# Patient Record
Sex: Male | Born: 1950 | Race: White | Hispanic: No | Marital: Married | Smoking: Never smoker
Health system: Southern US, Community
[De-identification: ages and names within clinical notes are randomized; demographics above are authoritative.]

## PROBLEM LIST (undated history)

## (undated) DIAGNOSIS — Q423 Congenital absence, atresia and stenosis of anus without fistula: Secondary | ICD-10-CM

## (undated) DIAGNOSIS — I10 Essential (primary) hypertension: Secondary | ICD-10-CM

## (undated) DIAGNOSIS — L988 Other specified disorders of the skin and subcutaneous tissue: Secondary | ICD-10-CM

## (undated) DIAGNOSIS — Z5189 Encounter for other specified aftercare: Secondary | ICD-10-CM

## (undated) DIAGNOSIS — K2289 Other specified disease of esophagus: Secondary | ICD-10-CM

## (undated) DIAGNOSIS — K222 Esophageal obstruction: Secondary | ICD-10-CM

## (undated) DIAGNOSIS — N289 Disorder of kidney and ureter, unspecified: Secondary | ICD-10-CM

## (undated) HISTORY — PX: ABDOMINAL SURGERY: SHX537

## (undated) HISTORY — PX: OTHER SURGICAL HISTORY: SHX169

## (undated) HISTORY — PX: FRACTURE SURGERY: SHX138

---

## 1982-01-31 HISTORY — PX: SPLENECTOMY: SUR1306

## 2004-04-02 ENCOUNTER — Ambulatory Visit: Payer: Self-pay | Admitting: Unknown Physician Specialty

## 2007-04-12 ENCOUNTER — Ambulatory Visit: Payer: Self-pay | Admitting: Family Medicine

## 2010-06-12 ENCOUNTER — Emergency Department: Payer: Self-pay | Admitting: Emergency Medicine
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
## 2011-12-04 IMAGING — CT CT HEAD WITHOUT CONTRAST
2 series · 16 of 30 positions shown, 20 images · non-contrast
Comparison: none

REASON FOR EXAM: headache
COMMENTS:

PROCEDURE:     CT  - CT HEAD WITHOUT CONTRAST  - June 12, 2010  [DATE]
RESULT:     History: Headache.
Comparison Study: No prior.

[Series 2: without · axial · non-contrast · 0.44mm/px · z∈[+384,+510]mm · 13 of 31 slices shown, 17 images]
[im 3/31  brain]
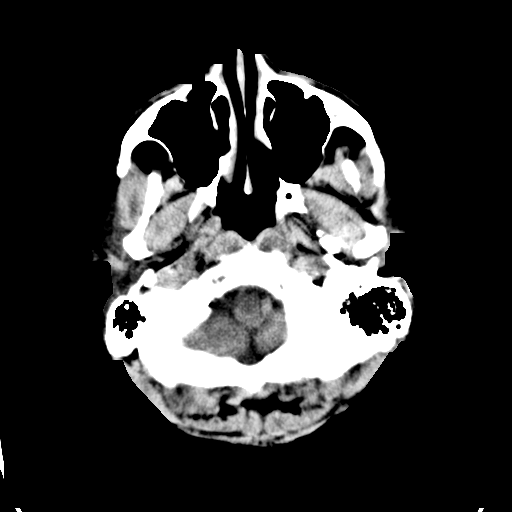
[im 3/31  bone]
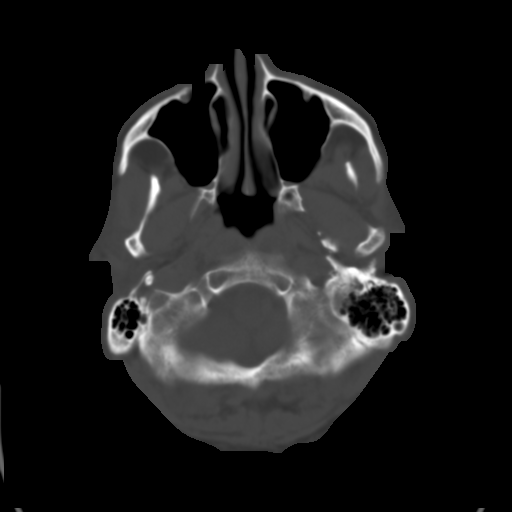
[im 5/31  brain]
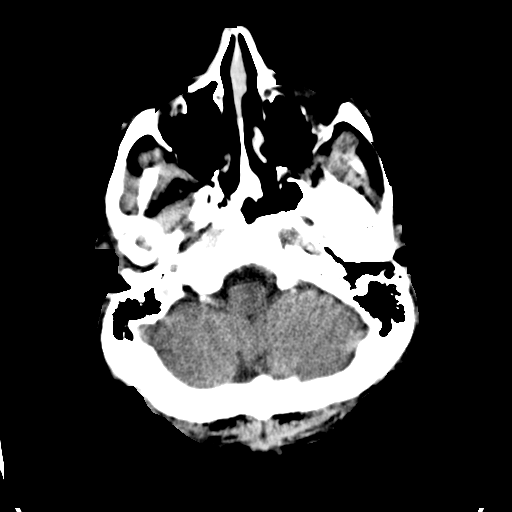
[im 7/31  brain]
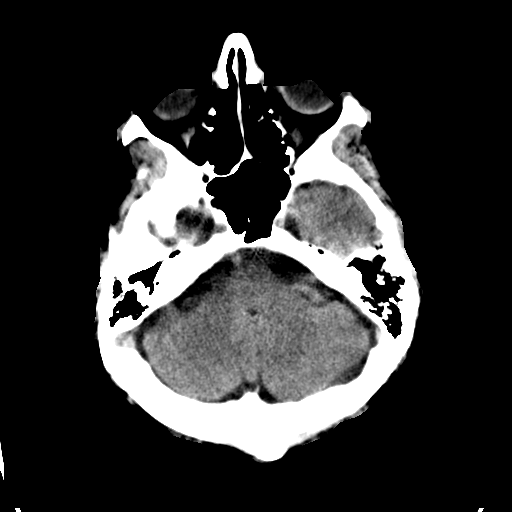
[im 9/31  brain]
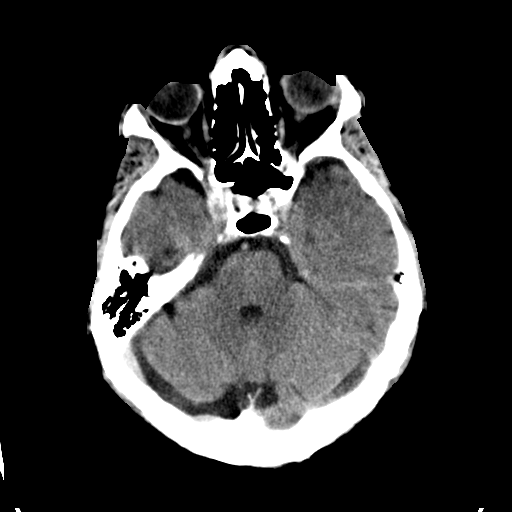
[im 11/31  brain]
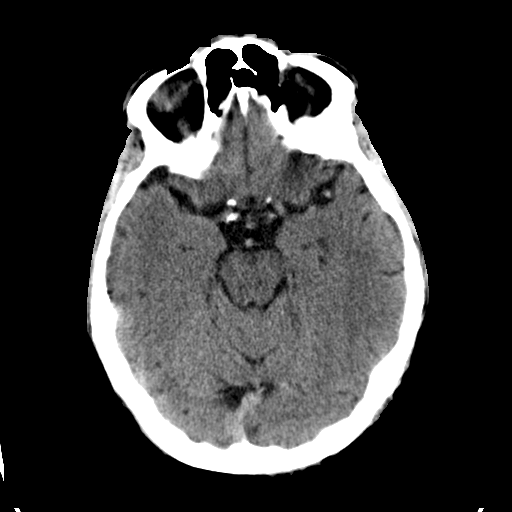
[im 11/31  bone]
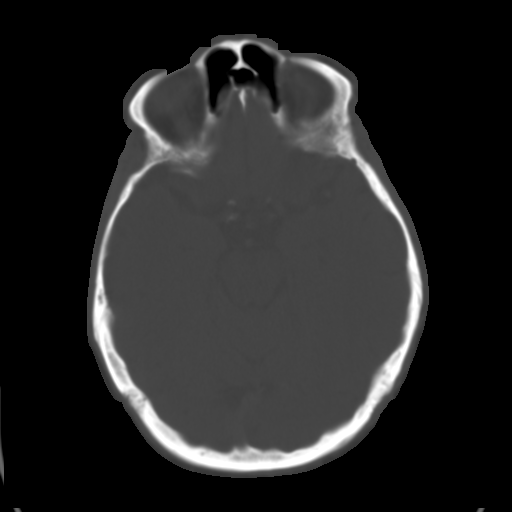
[im 13/31  brain]
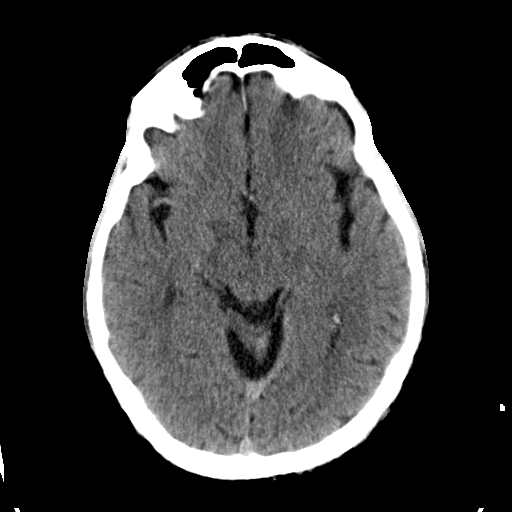
[im 16/31  brain]
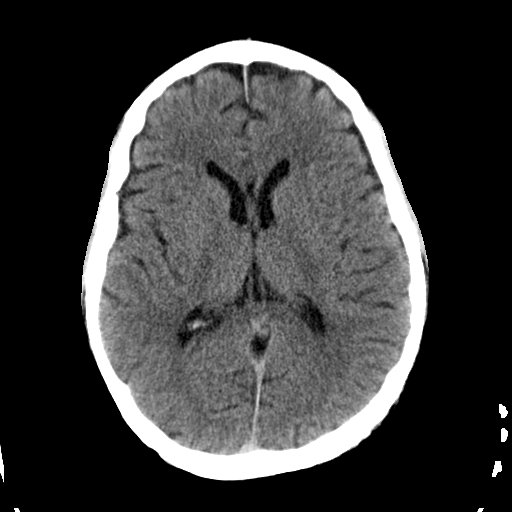
[im 18/31  brain]
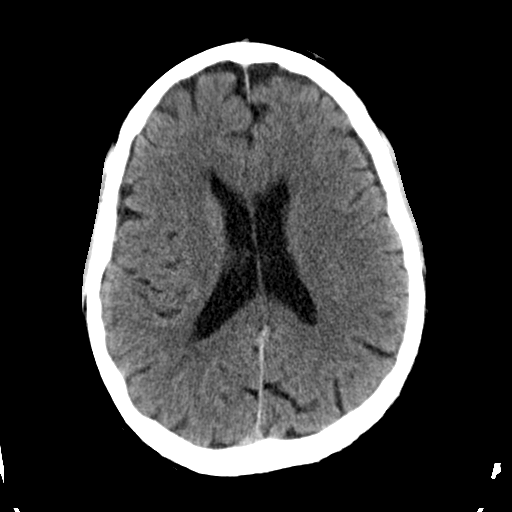
[im 20/31  brain]
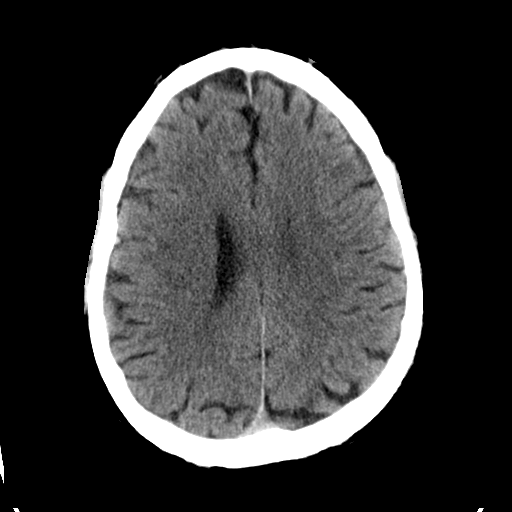
[im 20/31  bone]
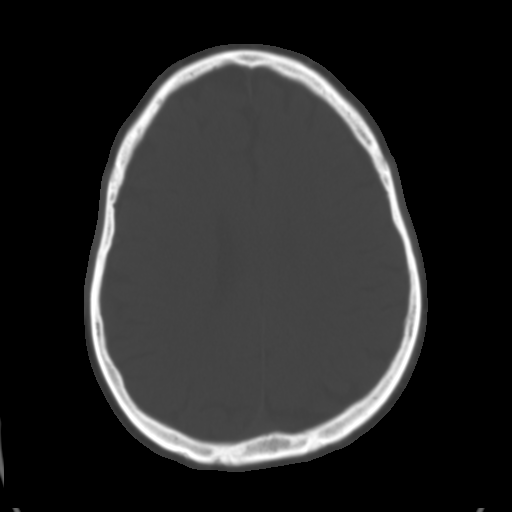
[im 22/31  brain]
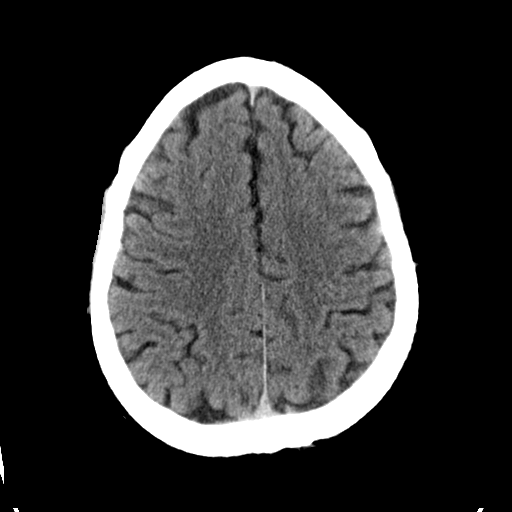
[im 24/31  brain]
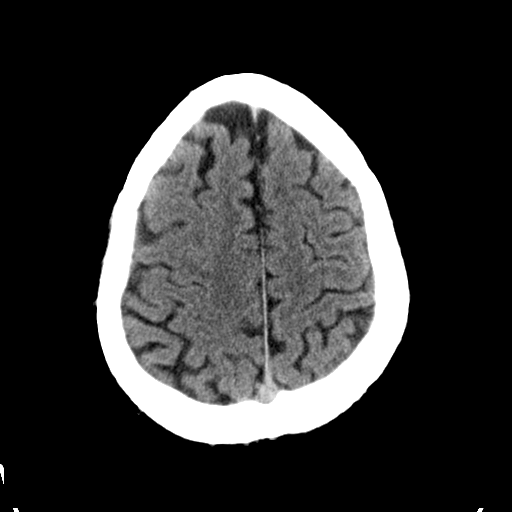
[im 26/31  brain]
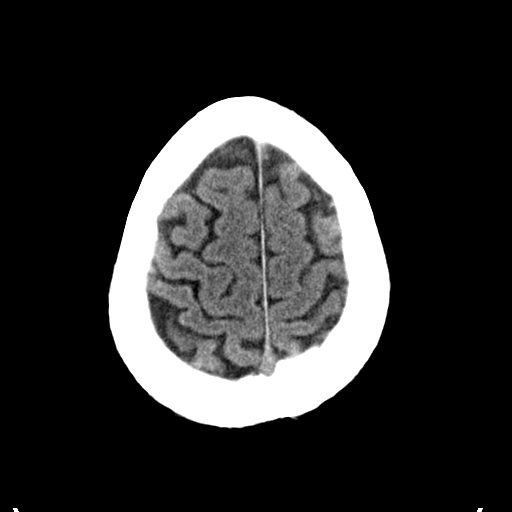
[im 28/31  brain]
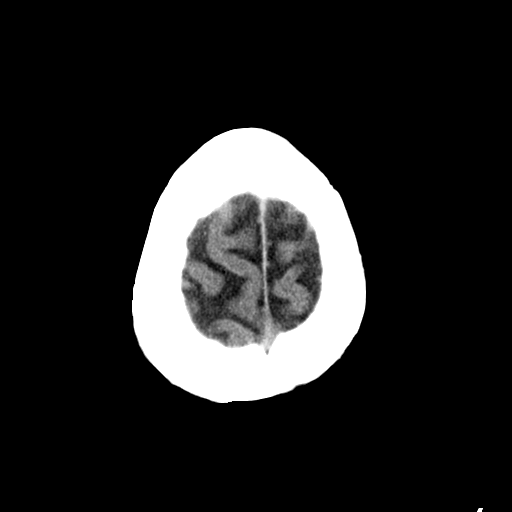
[im 28/31  bone]
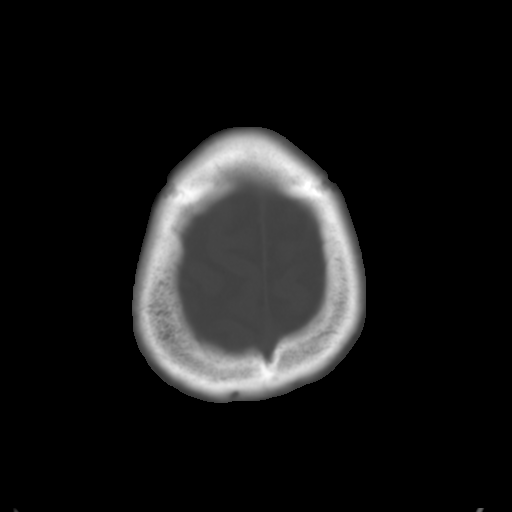

[Series 3: bone · axial · 0.44mm/px · z∈[+384,+424]mm · 3 of 31 slices shown]
[im 3/31  bone]
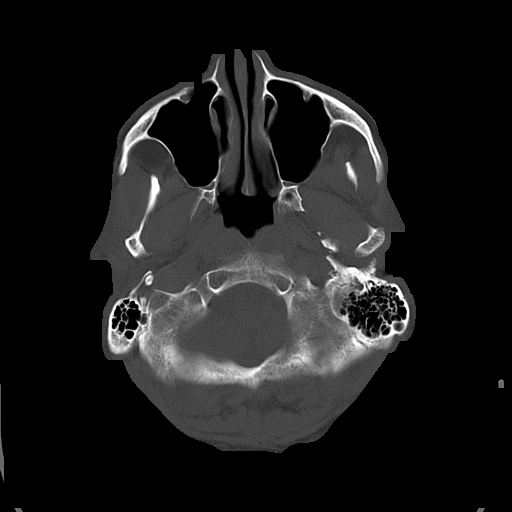
[im 7/31  bone]
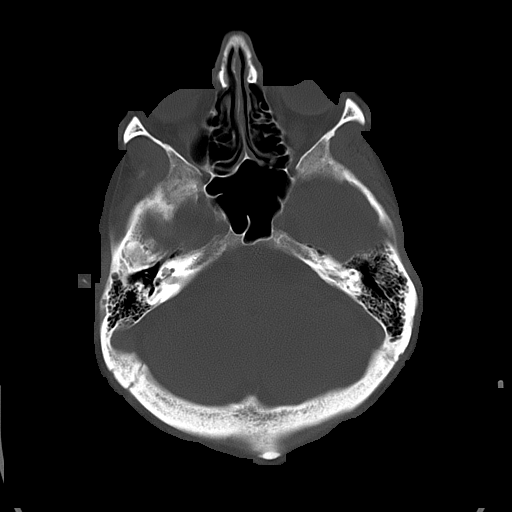
[im 11/31  bone]
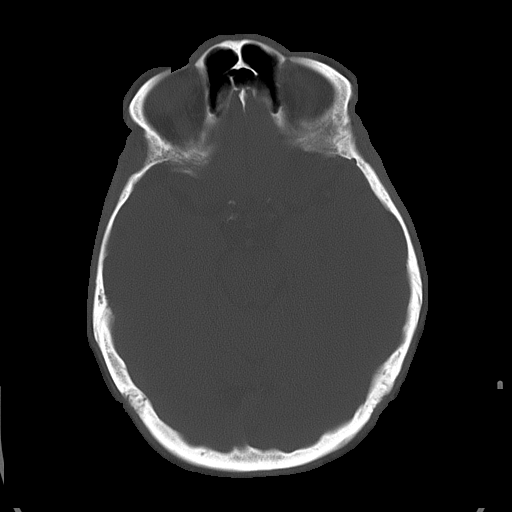

[16 of 30 positions shown; findings below may reference images not displayed]

FINDINGS: No mass. No hydrocephalus. No hemorrhage. No acute bony
abnormality identified. Sinuses clear.
IMPRESSION: No acute abnormality.

## 2015-05-25 ENCOUNTER — Other Ambulatory Visit: Payer: Self-pay | Admitting: Family Medicine

## 2015-05-25 ENCOUNTER — Ambulatory Visit
Admission: RE | Admit: 2015-05-25 | Discharge: 2015-05-25 | Disposition: A | Discharge status: Home / Self Care | Payer: Medicare HMO | Source: Ambulatory Visit | Attending: Family Medicine | Admitting: Family Medicine

## 2015-05-25 DIAGNOSIS — R059 Cough, unspecified: Secondary | ICD-10-CM

## 2015-05-25 DIAGNOSIS — R05 Cough: Secondary | ICD-10-CM

## 2015-09-26 ENCOUNTER — Emergency Department
Admission: EM | Admit: 2015-09-26 | Discharge: 2015-09-26 | Disposition: A | Discharge status: Home / Self Care | Payer: Medicare HMO | Attending: Emergency Medicine | Admitting: Emergency Medicine

## 2015-09-26 DIAGNOSIS — H109 Unspecified conjunctivitis: Secondary | ICD-10-CM | POA: Insufficient documentation

## 2015-09-26 DIAGNOSIS — H578 Other specified disorders of eye and adnexa: Secondary | ICD-10-CM | POA: Diagnosis present

## 2015-09-26 DIAGNOSIS — H5789 Other specified disorders of eye and adnexa: Secondary | ICD-10-CM

## 2015-09-26 MED ORDER — TETRACAINE HCL 0.5 % OP SOLN
1.0000 [drp] | Freq: Once | OPHTHALMIC | Status: AC
  Administered 2015-09-26: 1 [drp] via OPHTHALMIC

## 2015-09-26 MED ORDER — OFLOXACIN 0.3 % OP SOLN: 2.0000 [drp] | Freq: Four times a day (QID) | OPHTHALMIC | 0 refills | Status: AC

## 2015-09-26 MED ORDER — FLUORESCEIN SODIUM 1 MG OP STRP
ORAL_STRIP | OPHTHALMIC | Status: AC
  Administered 2015-09-26: 1 via OPHTHALMIC
  Filled 2015-09-26: qty 1

## 2015-09-26 MED ORDER — FLUORESCEIN SODIUM 1 MG OP STRP
1.0000 | ORAL_STRIP | Freq: Once | OPHTHALMIC | Status: AC
  Administered 2015-09-26: 1 via OPHTHALMIC

## 2015-09-26 MED ORDER — TETRACAINE HCL 0.5 % OP SOLN
OPHTHALMIC | Status: AC
  Administered 2015-09-26: 1 [drp] via OPHTHALMIC
  Filled 2015-09-26: qty 2

## 2015-09-26 NOTE — ED Provider Notes (Signed)
[facility] Emergency Department Provider Note   ____________________________________________   First MD Initiated Contact with Patient 09/26/15 0700     (approximate)  I have reviewed the triage vital signs and the nursing notes.   HISTORY  Chief Complaint Eye Problem   HPI Mark Bond is a 65 y.o. male without any chronic medical conditions was presenting to the emergency department today with left eye discharge and redness. He denies any change in his vision. He says that he felt something fly into his left eye when he was weed whacking yesterday. After he felt the foreign body sensation he flushed his eye and put ointment on his eye in addition to Visine. He says that he has since developed a discharge and discomfort in his eye especially laterally. He denies wearing contacts.  No past medical history on file.  There are no active problems to display for this patient.   No past surgical history on file.  Prior to Admission medications   Not on File    Allergies Review of patient's allergies indicates no known allergies.  No family history on file.  Social History Social History  Substance Use Topics  . Smoking status: Not on file  . Smokeless tobacco: Not on file  . Alcohol use Not on file    Review of Systems Constitutional: No fever/chills Eyes: No visual changes.As above. Patient denies any visual changes or blurring to left eye. ENT: No sore throat. Cardiovascular: Denies chest pain. Respiratory: Denies shortness of breath. Gastrointestinal: No abdominal pain.  No nausea, no vomiting.  No diarrhea.  No constipation. Genitourinary: Negative for dysuria. Musculoskeletal: Negative for back pain. Skin: Negative for rash. Neurological: Negative for headaches, focal weakness or numbness.  10-point ROS otherwise negative.  ____________________________________________   PHYSICAL EXAM:  VITAL SIGNS: ED Triage Vitals  Enc  Vitals Group     BP 09/26/15 0458 138/89     Pulse Rate 09/26/15 0458 62     Resp 09/26/15 0458 16     Temp 09/26/15 0458 97.7 F (36.5 C)     Temp Source 09/26/15 0458 Oral     SpO2 09/26/15 0458 98 %     Weight 09/26/15 0352 152 lb (68.9 kg)     Height 09/26/15 0352 5\' 9"  (1.753 m)     Head Circumference --      Peak Flow --      Pain Score 09/26/15 0352 3     Pain Loc --      Pain Edu? --      Excl. in GC? --     Constitutional: Alert and oriented. Well appearing and in no acute distress. Eyes: Mild conjunctival erythema to the left eye with edema lateral to the cornea which is mild to moderate. PERRL. note with tetracaine. EOMI.  I examined with a Woods lamp and I do not see any foreign body without staining. Eyelids were everted. With staining there is no uptake. There is no Seidel sign. There is also a mild yellow discharge and mild swelling to both eyelids. Head: Atraumatic. Nose: No congestion/rhinnorhea. Mouth/Throat: Mucous membranes are moist.   Neck: No stridor.   Cardiovascular: Normal rate, regular rhythm. Grossly normal heart sounds.   Respiratory: Normal respiratory effort.  No retractions. Lungs CTAB. Gastrointestinal:  No distention.  Musculoskeletal: No lower extremity tenderness nor edema.  Neurologic:  Normal speech and language. No gross focal neurologic deficits are appreciated. No gait instability. Skin:  Skin is warm, dry  and intact. No rash noted. Psychiatric: Mood and affect are normal. Speech and behavior are normal.  ____________________________________________   LABS (all labs ordered are listed, but only abnormal results are displayed)  Labs Reviewed - No data to display ____________________________________________  EKG   ____________________________________________  RADIOLOGY   ____________________________________________   PROCEDURES  Procedure(s) performed:   Procedures  Critical Care performed:    ____________________________________________   INITIAL IMPRESSION / ASSESSMENT AND PLAN / ED COURSE  Pertinent labs & imaging results that were available during my care of the patient were reviewed by me and considered in my medical decision making (see chart for details).  Patient with possible foreign body that has now been flushed out but with remaining irritation. I did not visualize any foreign body with a Woods lamp. I discussed the case with Dr. Brooke Dare of ophthalmology who agrees with my plan to discharge the patient with an antibiotic and have him follow up on Monday morning at 8 AM in his office. The patient we'll do go as a walk-in. Also, Dr. Brooke Dare says that the patient may call the office at any time if there are any worsening or concerning symptoms where he would need to be seen emergently by ophthalmology. I explained this plan to the patient and his understanding willing to comply.  Clinical Course   Scleral edema. Conjunctivitis.  ____________________________________________   FINAL CLINICAL IMPRESSION(S) / ED DIAGNOSES      NEW MEDICATIONS STARTED DURING THIS VISIT:  New Prescriptions   No medications on file     Note:  This document was prepared using Dragon voice recognition software and may include unintentional dictation errors.    Myrna Blazer, MD 09/26/15 [phone]

## 2015-09-26 NOTE — ED Triage Notes (Signed)
Pt ambulatory to registation with no difficulty. Pt reports he was weed eating today and got something in his left eye. Eye red, swollen and tearing at this time. Pt reports using visine and an opthalmic ointment he had.

## 2015-10-25 MED ORDER — METHYLPREDNISOLONE SODIUM SUCC 125 MG IJ SOLR
INTRAMUSCULAR | Status: AC
  Filled 2015-10-25: qty 2

## 2015-10-25 MED ORDER — IPRATROPIUM-ALBUTEROL 0.5-2.5 (3) MG/3ML IN SOLN
RESPIRATORY_TRACT | Status: AC
  Filled 2015-10-25: qty 9
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
## 2023-08-28 ENCOUNTER — Emergency Department
Admission: EM | Admit: 2023-08-28 | Discharge: 2023-08-28 | Disposition: A | Discharge status: Home / Self Care | Payer: Self-pay | Attending: Emergency Medicine | Admitting: Emergency Medicine

## 2023-08-28 ENCOUNTER — Other Ambulatory Visit: Payer: Self-pay

## 2023-08-28 DIAGNOSIS — R531 Weakness: Secondary | ICD-10-CM | POA: Diagnosis not present

## 2023-08-28 DIAGNOSIS — R42 Dizziness and giddiness: Secondary | ICD-10-CM | POA: Insufficient documentation

## 2023-08-28 HISTORY — DX: Esophageal obstruction: K22.2

## 2023-08-28 HISTORY — DX: Congenital absence, atresia and stenosis of anus without fistula: Q42.3

## 2023-08-28 HISTORY — DX: Essential (primary) hypertension: I10

## 2023-08-28 HISTORY — DX: Other specified disease of esophagus: K22.89

## 2023-08-28 HISTORY — DX: Disorder of kidney and ureter, unspecified: N28.9

## 2023-08-28 HISTORY — DX: Other specified disorders of the skin and subcutaneous tissue: L98.8

## 2023-08-28 HISTORY — DX: Encounter for other specified aftercare: Z51.89

## 2023-08-28 LAB — URINALYSIS, ROUTINE W REFLEX MICROSCOPIC
Bilirubin Urine: NEGATIVE
Glucose, UA: NEGATIVE mg/dL
Hgb urine dipstick: NEGATIVE
Ketones, ur: NEGATIVE mg/dL
Nitrite: POSITIVE — AB
Protein, ur: 30 mg/dL — AB
Specific Gravity, Urine: 1.013 (ref 1.005–1.030)
WBC, UA: >50 WBC/hpf (ref 0–5)
pH: 7 (ref 5.0–8.0)

## 2023-08-28 LAB — COMPREHENSIVE METABOLIC PANEL WITH GFR
ALT: 14 U/L (ref 0–44)
AST: 18 U/L (ref 15–41)
Albumin: 3.6 g/dL (ref 3.5–5.0)
Alkaline Phosphatase: 77 U/L (ref 38–126)
Anion gap: 11 (ref 5–15)
BUN: 11 mg/dL (ref 8–23)
CO2: 25 mmol/L (ref 22–32)
Calcium: 9.8 mg/dL (ref 8.9–10.3)
Chloride: 105 mmol/L (ref 98–111)
Creatinine, Ser: 1.1 mg/dL (ref 0.61–1.24)
GFR, Estimated: >60 mL/min (ref >60)
Glucose, Bld: 77 mg/dL (ref 70–99)
Potassium: 3.8 mmol/L (ref 3.5–5.1)
Sodium: 141 mmol/L (ref 135–145)
Total Bilirubin: 0.7 mg/dL (ref 0.0–1.2)
Total Protein: 7.3 g/dL (ref 6.5–8.1)

## 2023-08-28 LAB — CBC
HCT: 50.1 % (ref 39.0–52.0)
Hemoglobin: 16.4 g/dL (ref 13.0–17.0)
MCH: 28.1 pg (ref 26.0–34.0)
MCHC: 32.7 g/dL (ref 30.0–36.0)
MCV: 85.9 fL (ref 80.0–100.0)
Platelets: 297 K/uL (ref 150–400)
RBC: 5.83 MIL/uL — ABNORMAL HIGH (ref 4.22–5.81)
RDW: 16.7 % — ABNORMAL HIGH (ref 11.5–15.5)
WBC: 9.4 K/uL (ref 4.0–10.5)
nRBC: 0 % (ref 0.0–0.2)

## 2023-08-28 NOTE — ED Triage Notes (Signed)
 Pt to ED POV for bilateral arm weakness lasting 5 minutes that occurred about 20 minutes ago while folding clothes. Had 1 episode of diarrhea today.  Pt has full ROM to both arms and is ambulatory. Pt has equal hand grip (strong) and no arm drift. Pt states he just feels weak all over. Denies dizziness. PA in triage.

## 2023-08-28 NOTE — Discharge Instructions (Addendum)
 Your lab work was reassuring here, please follow-up closely with your PCP, return immediately if any new symptoms or worsening symptoms

## 2023-08-28 NOTE — ED Provider Notes (Signed)
 [facility] Provider Note    Event Date/Time   First MD Initiated Contact with Patient 08/28/23 1846     (approximate)   History   Extremity Weakness   HPI  Mark Bond is a 73 y.o. male who reports that he was doing laundry today and felt weak in the upper extremities and lightheaded, he reports this passed within 5 minutes and has been feeling well since then.  He denies chest pain, no back pain, no headache, no other neurodeficits.  Has been feeling well for several hours now     Physical Exam   Triage Vital Signs: ED Triage Vitals  Encounter Vitals Group     BP 08/28/23 1522 (!) 136/93     Girls Systolic BP Percentile --      Girls Diastolic BP Percentile --      Boys Systolic BP Percentile --      Boys Diastolic BP Percentile --      Pulse Rate 08/28/23 1522 76     Resp 08/28/23 1522 20     Temp 08/28/23 1522 97.8 F (36.6 C)     Temp Source 08/28/23 1522 Oral     SpO2 08/28/23 1522 97 %     Weight 08/28/23 1529 72.6 kg (160 lb)     Height 08/28/23 1529 1.778 m (5' 10)     Head Circumference --      Peak Flow --      Pain Score 08/28/23 1523 0     Pain Loc --      Pain Education --      Exclude from Growth Chart --     Most recent vital signs: Vitals:   08/28/23 1522 08/28/23 1929  BP: (!) 136/93 129/84  Pulse: 76 71  Resp: 20 20  Temp: 97.8 F (36.6 C)   SpO2: 97% 99%     General: Awake, no distress.  CV:  Good peripheral perfusion.  Regular rate and rhythm Resp:  Normal effort.  Clear to auscultation bilaterally Abd:  No distention.  No CVA tenderness Other:  Normal strength in all extremities, cranial nerves II through XII are normal,   ED Results / Procedures / Treatments   Labs (all labs ordered are listed, but only abnormal results are displayed) Labs Reviewed  CBC - Abnormal; Notable for the following components:      Result Value   RBC 5.83 (*)    RDW 16.7 (*)    All other components within normal  limits  URINALYSIS, ROUTINE W REFLEX MICROSCOPIC - Abnormal; Notable for the following components:   Color, Urine YELLOW (*)    APPearance CLOUDY (*)    Protein, ur 30 (*)    Nitrite POSITIVE (*)    Leukocytes,Ua LARGE (*)    Bacteria, UA MANY (*)    All other components within normal limits  COMPREHENSIVE METABOLIC PANEL WITH GFR  CBG MONITORING, ED     EKG  ED ECG REPORT I, Lamar Price, the attending physician, personally viewed and interpreted this ECG.  Date: 08/28/2023  Rhythm: normal sinus rhythm QRS Axis: normal Intervals: normal ST/T Wave abnormalities: normal Narrative Interpretation: no evidence of acute ischemia    RADIOLOGY     PROCEDURES:  Critical Care performed:   Procedures   MEDICATIONS ORDERED IN ED: Medications - No data to display   IMPRESSION / MDM / ASSESSMENT AND PLAN / ED COURSE  I reviewed the triage vital signs and the nursing notes.  Patient's presentation is most consistent with acute presentation with potential threat to life or bodily function.  Patient presents with weakness as detailed above, history seems to suggest a near syncopal episode, the patient had been working outside just prior to this, question he related illness, electrolyte abnormality.  Does not appear consistent with CVA given bilateral nature.  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  He has no dysuria, no CVA tenderness or fevers  Given the patient is feeling well here and has been asymptomatic for many hours with reassuring workup, feel he is appropriate for discharge, he agrees with this plan, he will return if any change in symptoms        FINAL CLINICAL IMPRESSION(S) / ED DIAGNOSES   Final diagnoses:  Weakness     Rx / DC Orders   ED Discharge Orders     None        Note:  This document was prepared  using Dragon voice recognition software and may include unintentional dictation errors.   Arlander Charleston, MD 08/28/23 2302

## 2023-08-28 NOTE — ED Provider Triage Note (Addendum)
 Emergency Medicine Provider Triage Evaluation Note  Mark Bond , a 73 y.o. male  was evaluated in triage.  Pt complains of upper extremities weakness.  Today patient he was folding clothes 20 minutes ago with acute onset of weakness on his upper extremities.  Patient denies working outdoors.  States having today at 1 PM an episode of of diarrhea.  Review of Systems  Positive:  Negative:   Physical Exam  BP (!) 136/93 (BP Location: Left Arm)   Pulse 76   Temp 97.8 F (36.6 C) (Oral)   Resp 20   SpO2 97%  Gen:   Awake, no distress during triage patient is hypertensive Resp:  Normal effort  MSK:   Moves extremities without difficulty  Other:    Medical Decision Making  Medically screening exam initiated at 3:24 PM.  Appropriate orders placed.  Mark Bond was informed that the remainder of the evaluation will be completed by another provider, this initial triage assessment does not replace that evaluation, and the importance of remaining in the ED until their evaluation is complete.  Patient who presents with history of acute onset of upper extremity weakness.  No focal deficits.  Ordered CBC and CMP   Janit Kast, PA-C 08/28/23 1525    Janit Kast, PA-C 08/28/23 1527

## 2023-09-28 ENCOUNTER — Emergency Department
Admission: EM | Admit: 2023-09-28 | Discharge: 2023-09-28 | Disposition: A | Discharge status: Home / Self Care | Attending: Emergency Medicine | Admitting: Emergency Medicine

## 2023-09-28 ENCOUNTER — Other Ambulatory Visit: Payer: Self-pay

## 2023-09-28 ENCOUNTER — Emergency Department

## 2023-09-28 DIAGNOSIS — N189 Chronic kidney disease, unspecified: Secondary | ICD-10-CM | POA: Diagnosis not present

## 2023-09-28 DIAGNOSIS — I129 Hypertensive chronic kidney disease with stage 1 through stage 4 chronic kidney disease, or unspecified chronic kidney disease: Secondary | ICD-10-CM | POA: Insufficient documentation

## 2023-09-28 DIAGNOSIS — N390 Urinary tract infection, site not specified: Secondary | ICD-10-CM | POA: Diagnosis not present

## 2023-09-28 DIAGNOSIS — K449 Diaphragmatic hernia without obstruction or gangrene: Secondary | ICD-10-CM | POA: Diagnosis not present

## 2023-09-28 DIAGNOSIS — R197 Diarrhea, unspecified: Secondary | ICD-10-CM | POA: Diagnosis not present

## 2023-09-28 DIAGNOSIS — R918 Other nonspecific abnormal finding of lung field: Secondary | ICD-10-CM | POA: Diagnosis not present

## 2023-09-28 DIAGNOSIS — R339 Retention of urine, unspecified: Secondary | ICD-10-CM | POA: Diagnosis present

## 2023-09-28 DIAGNOSIS — R112 Nausea with vomiting, unspecified: Secondary | ICD-10-CM

## 2023-09-28 LAB — CBC WITH DIFFERENTIAL/PLATELET
Abs Immature Granulocytes: 0.04 K/uL (ref 0.00–0.07)
Basophils Absolute: 0.1 K/uL (ref 0.0–0.1)
Basophils Relative: 1 %
Eosinophils Absolute: 0 K/uL (ref 0.0–0.5)
Eosinophils Relative: 0 %
HCT: 49.1 % (ref 39.0–52.0)
Hemoglobin: 16.1 g/dL (ref 13.0–17.0)
Immature Granulocytes: 0 %
Lymphocytes Relative: 8 %
Lymphs Abs: 1.2 K/uL (ref 0.7–4.0)
MCH: 28 pg (ref 26.0–34.0)
MCHC: 32.8 g/dL (ref 30.0–36.0)
MCV: 85.2 fL (ref 80.0–100.0)
Monocytes Absolute: 1.2 K/uL — ABNORMAL HIGH (ref 0.1–1.0)
Monocytes Relative: 8 %
Neutro Abs: 12.7 K/uL — ABNORMAL HIGH (ref 1.7–7.7)
Neutrophils Relative %: 83 %
Platelets: 299 K/uL (ref 150–400)
RBC: 5.76 MIL/uL (ref 4.22–5.81)
RDW: 17.4 % — ABNORMAL HIGH (ref 11.5–15.5)
WBC: 15.2 K/uL — ABNORMAL HIGH (ref 4.0–10.5)
nRBC: 0 % (ref 0.0–0.2)

## 2023-09-28 LAB — LIPASE, BLOOD: Lipase: 31 U/L (ref 11–51)

## 2023-09-28 LAB — COMPREHENSIVE METABOLIC PANEL WITH GFR
ALT: 12 U/L (ref 0–44)
AST: 20 U/L (ref 15–41)
Albumin: 3.8 g/dL (ref 3.5–5.0)
Alkaline Phosphatase: 73 U/L (ref 38–126)
Anion gap: 11 (ref 5–15)
BUN: 11 mg/dL (ref 8–23)
CO2: 23 mmol/L (ref 22–32)
Calcium: 9.8 mg/dL (ref 8.9–10.3)
Chloride: 106 mmol/L (ref 98–111)
Creatinine, Ser: 1.05 mg/dL (ref 0.61–1.24)
GFR, Estimated: >60 mL/min (ref >60)
Glucose, Bld: 100 mg/dL — ABNORMAL HIGH (ref 70–99)
Potassium: 3.8 mmol/L (ref 3.5–5.1)
Sodium: 140 mmol/L (ref 135–145)
Total Bilirubin: 1 mg/dL (ref 0.0–1.2)
Total Protein: 7 g/dL (ref 6.5–8.1)

## 2023-09-28 LAB — URINALYSIS, ROUTINE W REFLEX MICROSCOPIC
Bilirubin Urine: NEGATIVE
Glucose, UA: NEGATIVE mg/dL
Ketones, ur: NEGATIVE mg/dL
Nitrite: NEGATIVE
Protein, ur: NEGATIVE mg/dL
Specific Gravity, Urine: 1.004 — ABNORMAL LOW (ref 1.005–1.030)
Squamous Epithelial / HPF: 0 /HPF (ref 0–5)
WBC, UA: >50 WBC/hpf (ref 0–5)
pH: 6 (ref 5.0–8.0)

## 2023-09-28 LAB — TROPONIN I (HIGH SENSITIVITY): Troponin I (High Sensitivity): 7 ng/L (ref <18)

## 2023-09-28 MED ORDER — SODIUM CHLORIDE 0.9 % IV BOLUS (SEPSIS)
500.0000 mL | Freq: Once | INTRAVENOUS | Status: AC
  Administered 2023-09-28: 500 mL via INTRAVENOUS

## 2023-09-28 MED ORDER — SODIUM CHLORIDE 0.9 % IV SOLN
2.0000 g | Freq: Once | INTRAVENOUS | Status: AC
  Administered 2023-09-28: 2 g via INTRAVENOUS
  Filled 2023-09-28: qty 20

## 2023-09-28 MED ORDER — CEPHALEXIN 500 MG PO CAPS: 500.0000 mg | ORAL_CAPSULE | Freq: Two times a day (BID) | ORAL | 0 refills | Status: AC

## 2023-09-28 MED ORDER — ONDANSETRON HCL 4 MG/2ML IJ SOLN
4.0000 mg | Freq: Once | INTRAMUSCULAR | Status: AC
  Administered 2023-09-28: 4 mg via INTRAVENOUS
  Filled 2023-09-28: qty 2

## 2023-09-28 MED ORDER — IOHEXOL 300 MG/ML  SOLN
100.0000 mL | Freq: Once | INTRAMUSCULAR | Status: AC | PRN
  Administered 2023-09-28: 100 mL via INTRAVENOUS

## 2023-09-28 MED ORDER — ONDANSETRON 4 MG PO TBDP: 4.0000 mg | ORAL_TABLET | Freq: Four times a day (QID) | ORAL | 0 refills | Status: AC | PRN

## 2023-09-28 NOTE — ED Notes (Signed)
 Bladder scan shows pt is retaining 767 ml of urine in bladder. MD made aware.

## 2023-09-28 NOTE — ED Provider Notes (Signed)
-----------------------------------------   8:17 AM on 09/28/2023 ----------------------------------------- Patient care assumed from Dr. Neomi.  Patient CT scan has resulted showing urinary findings concerning for infection.  Patient states he always has infection given his chronic fistula.  Given the elevated white count we will still cover with antibiotics.  I did discuss the patient's lung nodules and recommended repeat CT scan in 3 months.  Patient agreeable to plan.  States he is ready to go.   Dorothyann Drivers, MD 09/28/23 [phone]

## 2023-09-28 NOTE — ED Triage Notes (Addendum)
 Pt reports he has been unable to urinate since 4pm yesterday. Pt reports lower abd discomfort, pt denies prostate issues. Pt denies dysuria or hematuria.
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
## 2023-09-28 NOTE — Discharge Instructions (Addendum)
 You may take over-the-counter Tylenol 1000 mg every 6 hours as needed for pain.  Please take your antibiotics until complete.  Please call to schedule follow-up with urology.  As we discussed your CT scan does show some enlarged nodules on your lungs.  We would recommend following up with your doctor for repeat CT scan in 3 months to evaluate further to ensure that there is no enlarging.

## 2023-09-30 LAB — URINE CULTURE: Culture: >=100000 — AB

## 2023-12-14 ENCOUNTER — Other Ambulatory Visit: Payer: Self-pay | Admitting: Internal Medicine

## 2023-12-14 DIAGNOSIS — G459 Transient cerebral ischemic attack, unspecified: Secondary | ICD-10-CM

## 2023-12-17 ENCOUNTER — Ambulatory Visit
Admission: RE | Admit: 2023-12-17 | Discharge: 2023-12-17 | Disposition: A | Discharge status: Home / Self Care | Source: Ambulatory Visit | Attending: Internal Medicine | Admitting: Internal Medicine

## 2023-12-17 DIAGNOSIS — G459 Transient cerebral ischemic attack, unspecified: Secondary | ICD-10-CM | POA: Insufficient documentation
